# Patient Record
Sex: Male | Born: 1955 | Race: White | Hispanic: No | Marital: Single | Smoking: Never smoker
Health system: Southern US, Community
[De-identification: ages and names within clinical notes are randomized; demographics above are authoritative.]

---

## 2018-09-30 ENCOUNTER — Emergency Department (HOSPITAL_COMMUNITY): Payer: Self-pay

## 2018-09-30 ENCOUNTER — Encounter (HOSPITAL_COMMUNITY): Payer: Self-pay | Admitting: Emergency Medicine

## 2018-09-30 ENCOUNTER — Ambulatory Visit (HOSPITAL_COMMUNITY)
Admission: RE | Admit: 2018-09-30 | Discharge: 2018-09-30 | Disposition: A | Discharge status: Home / Self Care | Payer: Self-pay | Source: Ambulatory Visit | Attending: Urgent Care | Admitting: Urgent Care

## 2018-09-30 ENCOUNTER — Telehealth: Payer: Self-pay

## 2018-09-30 ENCOUNTER — Other Ambulatory Visit: Payer: Self-pay

## 2018-09-30 ENCOUNTER — Emergency Department (HOSPITAL_COMMUNITY)
Admission: EM | Admit: 2018-09-30 | Discharge: 2018-09-30 | Disposition: A | Discharge status: Home / Self Care | Payer: Self-pay | Attending: Emergency Medicine | Admitting: Emergency Medicine

## 2018-09-30 ENCOUNTER — Ambulatory Visit (HOSPITAL_COMMUNITY)
Admission: EM | Admit: 2018-09-30 | Discharge: 2018-09-30 | Disposition: A | Discharge status: Home / Self Care | Payer: Self-pay | Attending: Urgent Care | Admitting: Urgent Care

## 2018-09-30 ENCOUNTER — Encounter (HOSPITAL_COMMUNITY): Payer: Self-pay | Admitting: Urgent Care

## 2018-09-30 DIAGNOSIS — I824Y1 Acute embolism and thrombosis of unspecified deep veins of right proximal lower extremity: Secondary | ICD-10-CM | POA: Insufficient documentation

## 2018-09-30 DIAGNOSIS — R600 Localized edema: Secondary | ICD-10-CM | POA: Insufficient documentation

## 2018-09-30 DIAGNOSIS — I2699 Other pulmonary embolism without acute cor pulmonale: Secondary | ICD-10-CM | POA: Insufficient documentation

## 2018-09-30 DIAGNOSIS — M7989 Other specified soft tissue disorders: Secondary | ICD-10-CM | POA: Insufficient documentation

## 2018-09-30 DIAGNOSIS — R609 Edema, unspecified: Secondary | ICD-10-CM | POA: Insufficient documentation

## 2018-09-30 LAB — BASIC METABOLIC PANEL
Anion gap: 8 (ref 5–15)
BUN: 13 mg/dL (ref 8–23)
CO2: 30 mmol/L (ref 22–32)
Calcium: 9.3 mg/dL (ref 8.9–10.3)
Chloride: 103 mmol/L (ref 98–111)
Creatinine, Ser: 1.34 mg/dL — ABNORMAL HIGH (ref 0.61–1.24)
GFR calc Af Amer: >60 mL/min (ref >60)
GFR calc non Af Amer: 56 mL/min — ABNORMAL LOW (ref >60)
Glucose, Bld: 126 mg/dL — ABNORMAL HIGH (ref 70–99)
Potassium: 4.6 mmol/L (ref 3.5–5.1)
Sodium: 141 mmol/L (ref 135–145)

## 2018-09-30 LAB — CBC
HCT: 44.5 % (ref 39.0–52.0)
Hemoglobin: 14.7 g/dL (ref 13.0–17.0)
MCH: 29.8 pg (ref 26.0–34.0)
MCHC: 33 g/dL (ref 30.0–36.0)
MCV: 90.1 fL (ref 80.0–100.0)
Platelets: 231 10*3/uL (ref 150–400)
RBC: 4.94 MIL/uL (ref 4.22–5.81)
RDW: 12.3 % (ref 11.5–15.5)
WBC: 7 10*3/uL (ref 4.0–10.5)
nRBC: 0 % (ref 0.0–0.2)

## 2018-09-30 MED ORDER — RIVAROXABAN (XARELTO) VTE STARTER PACK (15 & 20 MG): ORAL_TABLET | ORAL | 0 refills | Status: AC

## 2018-09-30 MED ORDER — RIVAROXABAN 15 MG PO TABS: 15.0000 mg | ORAL_TABLET | Freq: Two times a day (BID) | ORAL | 0 refills | Status: DC

## 2018-09-30 MED ORDER — IOHEXOL 350 MG/ML SOLN
75.0000 mL | Freq: Once | INTRAVENOUS | Status: AC | PRN
  Administered 2018-09-30: 75 mL via INTRAVENOUS

## 2018-09-30 NOTE — ED Notes (Signed)
1:00 Vascular ultrasound appt [facility]. Pt given directions and appt time and discharged out of [place] with steady gait.

## 2018-09-30 NOTE — Discharge Instructions (Addendum)
Return here as needed.  Follow-up with the resources provided. °

## 2018-09-30 NOTE — ED Triage Notes (Addendum)
Onset 3 weeks ago developed right lower extremity swelling and pain. Had ultrasound positive for DVT. Was discharged with prescription for Alen Blew however patient riding a bike. Sent to the ED for assistance. States probably couldn't afford the medication.

## 2018-09-30 NOTE — ED Triage Notes (Signed)
Pt here with right leg pain and swelling x 3 weeks

## 2018-09-30 NOTE — ED Provider Notes (Addendum)
MRN: [id number] DOB: 08/19/1955  Subjective:   Martin Zimmerman is a 63 y.o. male presenting for 3 week history of progressively worsening intermittent right lower leg swelling and pain. He thought it was due to salt intake so he cut down on his salt intake, hydrated with water daily. He was eating very unhealthily.  This is made somewhat of a difference but in the past week and especially the last day his right lower leg swelling has returned and is moderate to severely bothersome with associated bruising.  He is not currently taking any medications and has no known food or drug allergies.  Denies past medical and surgical history. Denies smoking cigarettes, quit 15 years ago. Has an occasional beer.  Denies recent surgery, trauma, long distance travel, history of DVT or clotting disorder.  Reports that he has generally been healthy.  He does not have a PCP.  ROS  Objective:   Vitals: BP 123/84 (BP Location: Right Arm)   Pulse 90   Temp 98.4 F (36.9 C) (Oral)   Resp 18   SpO2 96%   BP was 123/84 on recheck by PA Bela Bonaparte at 12:34.  Physical Exam Constitutional:      General: He is not in acute distress.    Appearance: Normal appearance. He is well-developed. He is not ill-appearing, toxic-appearing or diaphoretic.  HENT:     Head: Normocephalic and atraumatic.     Right Ear: External ear normal.     Left Ear: External ear normal.     Nose: Nose normal.     Mouth/Throat:     Mouth: Mucous membranes are moist.     Pharynx: Oropharynx is clear.  Eyes:     General: No scleral icterus.    Extraocular Movements: Extraocular movements intact.     Pupils: Pupils are equal, round, and reactive to light.  Neck:     Musculoskeletal: Normal range of motion and neck supple.     Vascular: No carotid bruit.  Cardiovascular:     Rate and Rhythm: Normal rate and regular rhythm.     Heart sounds: Normal heart sounds. No murmur. No friction rub. No gallop.   Pulmonary:     Effort: Pulmonary effort  is normal. No respiratory distress.     Breath sounds: Normal breath sounds. No stridor. No wheezing, rhonchi or rales.  Musculoskeletal: Normal range of motion.        General: Tenderness present. No signs of injury.     Right lower leg: Edema (1+ up to mid calf, has associated petechiae of the lower leg) present.     Left lower leg: No edema.  Skin:    General: Skin is warm and dry.  Neurological:     General: No focal deficit present.     Mental Status: He is alert and oriented to person, place, and time.     Cranial Nerves: No cranial nerve deficit.     Motor: No weakness.     Coordination: Coordination normal.     Gait: Gait normal.     Deep Tendon Reflexes: Reflexes normal.  Psychiatric:        Mood and Affect: Mood normal.        Behavior: Behavior normal.        Thought Content: Thought content normal.        Judgment: Judgment normal.     Assessment and Plan :   1. Right leg swelling   2. Leg edema     We will  pursue ultrasound of her right lower leg.  Obtained a stat ultrasound for today at 1 PM.  Labs pending, will follow-up thereafter.  Counseled on need to establish with a PCP for further work-up and follow-up.  I placed patient in a work queue with [facility] for PCP assistance and [facility]    Wallis BambergMani, Azhar Knope, PA-C 09/30/18 1252   UPDATE:  Positive DVT by verbal report, imaging findings not posted yet.  Results for orders placed or performed during the hospital encounter of 09/30/18 (from the past 24 hour(s))  CBC     Status: None   Collection Time: 09/30/18 12:39 PM  Result Value Ref Range   WBC 7.0 4.0 - 10.5 K/uL   RBC 4.94 4.22 - 5.81 MIL/uL   Hemoglobin 14.7 13.0 - 17.0 g/dL   HCT 16.144.5 09.639.0 - 04.552.0 %   MCV 90.1 80.0 - 100.0 fL   MCH 29.8 26.0 - 34.0 pg   MCHC 33.0 30.0 - 36.0 g/dL   RDW 40.912.3 81.111.5 - 91.415.5 %   Platelets 231 150 - 400 K/uL   nRBC 0.0 0.0 - 0.2 %  Basic metabolic panel     Status: Abnormal    Collection Time: 09/30/18 12:39 PM  Result Value Ref Range   Sodium 141 135 - 145 mmol/L   Potassium 4.6 3.5 - 5.1 mmol/L   Chloride 103 98 - 111 mmol/L   CO2 30 22 - 32 mmol/L   Glucose, Bld 126 (H) 70 - 99 mg/dL   BUN 13 8 - 23 mg/dL   Creatinine, Ser 7.821.34 (H) 0.61 - 1.24 mg/dL   Calcium 9.3 8.9 - 95.610.3 mg/dL   GFR calc non Af Amer 56 (L) >60 mL/min   GFR calc Af Amer >60 >60 mL/min   Anion gap 8 5 - 15   Discussed results with patient including strict ER precautions.  Will have patient start Xarelto at 15 mg twice daily.  Patient is to follow-up with vascular specialist.  Information provided to patient and his visit summary.   Wallis BambergMani, Moneisha Vosler, PA-C 09/30/18 1336

## 2018-09-30 NOTE — Telephone Encounter (Signed)
Message received from Laurena Slimmer, RN CM requesting a hospital follow up appointment for patient.  Informed her that an appointment has been scheduled for 10/07/2018 @ 1050 @ [place]

## 2018-09-30 NOTE — Discharge Instructions (Addendum)
You have an appointment for an ultrasound of your right lower leg at 1:00.  Make sure you take Entrance C see off of [employer].   For elevated blood pressure, make sure you are monitoring salt in your diet.  Do not eat restaurant foods and limit processed foods at home.  Processed foods include things like frozen meals preseason meats and dinners.  Make sure your pain attention to sodium labels on foods you by at the grocery store.  For seasoning you can use a brand called Mrs. Dash which includes a lot of salt free seasonings.  Salads - kale, spinach, cabbage, spring mix; use seeds like pumpkin seeds or sunflower seeds, almonds; you can also use 1-2 hard boiled eggs in your salads Fruits - avocadoes, berries (blueberries, raspberries, blackberries), apples, oranges, pomegranate, grapefruit Vegetables - aspargus, cauliflower, broccoli, green beans, brussel spouts, bell peppers; stay away from starchy vegetables like potatoes, carrots, peas  Regarding meat it is better to eat lean meats and limit your red meat consumption including pork.  Wild caught fish, chicken breast are good options.  Do not eat any foods on this list that you are allergic to.

## 2018-09-30 NOTE — Progress Notes (Signed)
RLE venous duplex       has been completed. Preliminary results can be found under CV proc through chart review. June Leap, BS, RDMS, RVT    Called positive results to [place], [place]. Patient instructed to pick up [facility]arel[facility] at [facility] today.  This tech discovered patient rode bicycle to appointment and also patient mentioned not being able to afford prescription. Called back to [facility] to discuss with Freida Busman. He agrees best course for patient would be to go to ED.

## 2018-09-30 NOTE — ED Provider Notes (Signed)
[facility]   CSN: [id number] Arrival date & time: 09/30/18  1350     History   Chief Complaint Chief Complaint  Patient presents with   Leg Problem    HPI Julieanne MansonRobert Glacken is a 63 y.o. male.     HPI Patient presents to the emergency department with swelling in the right lower extremity that started 2 weeks ago.  He states that he is somewhat short of breath as well.  Patient had ultrasound that showed DVT.  Patient states that he was unable to for medicine and was sent here to the emergency department for further evaluation of this issue.  Patient denies any chest pain, fever, cough, nausea, vomiting or weakness. History reviewed. No pertinent past medical history.  There are no active problems to display for this patient.   History reviewed. No pertinent surgical history.      Home Medications    Prior to Admission medications   Medication Sig Start Date End Date Taking? Authorizing Provider  Rivaroxaban (XARELTO) 15 MG TABS tablet Take 1 tablet (15 mg total) by mouth 2 (two) times daily with a meal. 09/30/18   Wallis BambergMani, Mario, PA-C    Family History Family History  Problem Relation Age of Onset   Cancer Mother    Hypertension Father     Social History Social History   Tobacco Use   Smoking status: Never Smoker   Smokeless tobacco: Never Used  Substance Use Topics   Alcohol use: Not Currently   Drug use: Never     Allergies   Patient has no known allergies.   Review of Systems Review of Systems  All other systems negative except as documented in the HPI. All pertinent positives and negatives as reviewed in the HPI. Physical Exam Updated Vital Signs BP (!) 108/95    Pulse 71    Temp 98.4 F (36.9 C) (Oral)    Resp (!) 26    Ht 5\' 11"  (1.803 m)    Wt 90.7 kg    SpO2 100%    BMI 27.89 kg/m   Physical Exam Vitals signs and nursing note reviewed.  Constitutional:      General: He is not in  acute distress.    Appearance: He is well-developed.  HENT:     Head: Normocephalic and atraumatic.  Eyes:     Pupils: Pupils are equal, round, and reactive to light.  Neck:     Musculoskeletal: Normal range of motion and neck supple.  Cardiovascular:     Rate and Rhythm: Normal rate and regular rhythm.     Heart sounds: Normal heart sounds. No murmur. No friction rub. No gallop.   Pulmonary:     Effort: Pulmonary effort is normal. No respiratory distress.     Breath sounds: Normal breath sounds. No wheezing.  Abdominal:     General: Bowel sounds are normal. There is no distension.     Palpations: Abdomen is soft.     Tenderness: There is no abdominal tenderness.  Musculoskeletal:     Right lower leg: Edema present.  Skin:    General: Skin is warm and dry.     Capillary Refill: Capillary refill takes less than 2 seconds.     Findings: No erythema or rash.  Neurological:     Mental Status: He is alert and oriented to person, place, and time.     Motor: No abnormal muscle tone.     Coordination: Coordination normal.  Psychiatric:        Behavior: Behavior normal.      ED Treatments / Results  Labs (all labs ordered are listed, but only abnormal results are displayed) Labs Reviewed - No data to display  EKG EKG Interpretation  Date/Time:  Tuesday September 30 2018 16:13:47 EDT Ventricular Rate:  65 PR Interval:    QRS Duration: 94 QT Interval:  403 QTC Calculation: 419 R Axis:   47 Text Interpretation:  Sinus rhythm No STEMI  Confirmed by Alona BeneLong, Joshua [phone]([id number]) on 09/30/2018 4:45:51 PM   Radiology Ct Angio Chest Pe W/cm &/or Wo Cm  Result Date: 09/30/2018 CLINICAL DATA:  Right leg pain and swelling for several weeks EXAM: CT ANGIOGRAPHY CHEST WITH CONTRAST TECHNIQUE: Multidetector CT imaging of the chest was performed using the standard protocol during bolus administration of intravenous contrast. Multiplanar CT image reconstructions and MIPs were obtained to evaluate  the vascular anatomy. CONTRAST:  75mL OMNIPAQUE IOHEXOL 350 MG/ML SOLN COMPARISON:  None. FINDINGS: Cardiovascular: Thoracic aorta is within normal limits without aneurysmal dilatation or dissection. The pulmonary artery is well visualized within normal branching pattern. Filling defects are noted primarily in the lower lobes bilaterally slightly worse on the left than the right consistent with pulmonary emboli. No right heart strain is identified. Mediastinum/Nodes: Sliding-type hiatal hernia is noted. No hilar or mediastinal adenopathy is noted. The thoracic inlet is within normal limits. Lungs/Pleura: Emphysematous changes are noted. No focal infiltrate or sizable effusion is seen. No pleural effusion is seen. Upper Abdomen: No acute abnormality. Musculoskeletal: No chest wall abnormality. No acute or significant osseous findings. Review of the MIP images confirms the above findings. IMPRESSION: Evidence of mild pulmonary emboli without heart strain. Sliding-type hiatal hernia. Electronically Signed   By: Alcide CleverMark  Lukens M.D.   On: 09/30/2018 19:22   [facility] [place] Lower Extremity Venous (dvt) (only Mc & Wl)  Result Date: 09/30/2018  Lower Venous Study Indications: RLE swelling x 3 weeks.  Performing Technologist: Jeb LeveringJill Parker RDMS, RVT  Examination Guidelines: A complete evaluation includes B-mode imaging, spectral Doppler, color Doppler, and power Doppler as needed of all accessible portions of each vessel. Bilateral testing is considered an integral part of a complete examination. Limited examinations for reoccurring indications may be performed as noted.  +---------+---------------+---------+-----------+----------+-------------------+  RIGHT     Compressibility Phasicity Spontaneity Properties Summary              +---------+---------------+---------+-----------+----------+-------------------+  CFV       Partial         Yes       Yes                    distal segment                                                                    thrombus- mildly                                                                 mobile               +---------+---------------+---------+-----------+----------+-------------------+  SFJ       Full            Yes       Yes                                         +---------+---------------+---------+-----------+----------+-------------------+  FV Prox   None                                                                  +---------+---------------+---------+-----------+----------+-------------------+  FV Mid    None                                                                  +---------+---------------+---------+-----------+----------+-------------------+  FV Distal None                                                                  +---------+---------------+---------+-----------+----------+-------------------+  PFV       Full                                                                  +---------+---------------+---------+-----------+----------+-------------------+  POP       None            No        No                                          +---------+---------------+---------+-----------+----------+-------------------+  PTV       Partial                                                               +---------+---------------+---------+-----------+----------+-------------------+  PERO      Partial                                                               +---------+---------------+---------+-----------+----------+-------------------+   +----+---------------+---------+-----------+----------+-------+  LEFT Compressibility Phasicity Spontaneity Properties Summary  +----+---------------+---------+-----------+----------+-------+  CFV  Full            Yes  Yes                             +----+---------------+---------+-----------+----------+-------+  SFJ  Full                                                      +----+---------------+---------+-----------+----------+-------+      Summary: Right: Findings consistent with acute deep vein thrombosis involving the distal right common femoral vein, right femoral vein, right popliteal vein, right posterior tibial veins, and right peroneal veins. Left: No evidence of common femoral vein obstruction.  *See table(s) above for measurements and observations. Electronically signed by Coral ElseVance Brabham MD on 09/30/2018 at 8:41:47 PM.    Final     Procedures Procedures (including critical care time)  Medications Ordered in ED Medications  iohexol (OMNIPAQUE) 350 MG/ML injection 75 mL (75 mLs Intravenous Contrast Given 09/30/18 1913)     Initial Impression / Assessment and Plan / ED Course  I have reviewed the triage vital signs and the nursing notes.  Pertinent labs & imaging results that were available during my care of the patient were reviewed by me and considered in my medical decision making (see chart for details).        Case manager was able to secure the patient's medications had no cost.  She also set up a follow-up appointment with him at the wellness center.  Patient does have mild very small PEs in the periphery in the lower lobes.  Patient has been stable with normal vital signs and normal pulse oximetry.  He is walked without any issues or increasing shortness of breath or tachypnea.  Final Clinical Impressions(s) / ED Diagnoses   Final diagnoses:  None    ED Discharge Orders    None       Charlestine NightLawyer, Pamela Intrieri, PA-C 10/01/18 0004    Maia PlanLong, Joshua G, MD 10/01/18 [phone]

## 2018-10-07 ENCOUNTER — Ambulatory Visit: Payer: Self-pay | Admitting: Family Medicine

## 2018-11-21 ENCOUNTER — Encounter: Payer: Self-pay | Admitting: Vascular Surgery

## 2019-06-24 ENCOUNTER — Encounter (HOSPITAL_COMMUNITY): Payer: Self-pay | Admitting: Emergency Medicine

## 2019-06-24 ENCOUNTER — Ambulatory Visit (HOSPITAL_COMMUNITY)
Admission: EM | Admit: 2019-06-24 | Discharge: 2019-06-24 | Disposition: A | Discharge status: Home / Self Care | Payer: Self-pay | Attending: Family Medicine | Admitting: Family Medicine

## 2019-06-24 ENCOUNTER — Other Ambulatory Visit: Payer: Self-pay

## 2019-06-24 DIAGNOSIS — W57XXXA Bitten or stung by nonvenomous insect and other nonvenomous arthropods, initial encounter: Secondary | ICD-10-CM

## 2019-06-24 DIAGNOSIS — S30861A Insect bite (nonvenomous) of abdominal wall, initial encounter: Secondary | ICD-10-CM

## 2019-06-24 NOTE — ED Provider Notes (Signed)
[facility] CARE CENTER   [id number] 06/24/19 Arrival Time: 1150  ASSESSMENT & PLAN:  1. Tick bite of abdominal wall, initial encounter     No signs of remaining tick in skin. No symptoms otherwise. Discussed return should he develop flu-like symptoms. No signs of bacterial infection. Reassured.  Follow-up Information    [facility].   Specialty: Urgent Care Why: As needed. Contact information: [address] [place] [address] [phone]          Reviewed expectations re: course of current medical issues. Questions answered. Outlined signs and symptoms indicating need for more acute intervention. Understanding verbalized. After Visit Summary given.   SUBJECTIVE: History from: patient. Martin Zimmerman is a 64 y.o. male who reports finding a tick on his R side/buttock. Removed at home "but not sure I got all of it out". Red area at site of bite; stable. No drainage. Afebrile. Ambulatory without difficulty. Normal PO intake without n/v/d.    OBJECTIVE:  Vitals:   06/24/19 1237  BP: 113/74  Pulse: 75  Resp: 16  Temp: 98.7 F (37.1 C)  TempSrc: Oral  SpO2: 97%    General appearance: alert; no distress Eyes: PERRLA; EOMI; conjunctiva normal HENT: [place]; AT Neck: supple  Lungs: speaks full sentences without difficulty; unlabored Extremities: no edema Skin: warm and dry; R side/buttock with approx 1 cm area of erythema at site of tick bite; no retained FB Neurologic: normal gait Psychological: alert and cooperative; normal mood and affect   Allergies  Allergen Reactions  . Codeine Hives    History reviewed. No pertinent past medical history. Social History   Socioeconomic History  . Marital status: Single    Spouse name: Not on file  . Number of children: Not on file  . Years of education: Not on file  . Highest education level: Not on file  Occupational History  . Not on file  Tobacco Use  .  Smoking status: Never Smoker  . Smokeless tobacco: Never Used  Substance and Sexual Activity  . Alcohol use: Not Currently  . Drug use: Never  . Sexual activity: Not on file  Other Topics Concern  . Not on file  Social History Narrative  . Not on file   Social Determinants of Health   Financial Resource Strain:   . Difficulty of Paying Living Expenses:   Food Insecurity:   . Worried About Programme researcher, broadcasting/film/video in the Last Year:   . Barista in the Last Year:   Transportation Needs:   . Freight forwarder (Medical):   Marland Kitchen Lack of Transportation (Non-Medical):   Physical Activity:   . Days of Exercise per Week:   . Minutes of Exercise per Session:   Stress:   . Feeling of Stress :   Social Connections:   . Frequency of Communication with Friends and Family:   . Frequency of Social Gatherings with Friends and Family:   . Attends Religious Services:   . Active Member of Clubs or Organizations:   . Attends Banker Meetings:   Marland Kitchen Marital Status:   Intimate Partner Violence:   . Fear of Current or Ex-Partner:   . Emotionally Abused:   Marland Kitchen Physically Abused:   . Sexually Abused:    Family History  Problem Relation Age of Onset  . Cancer Mother   . Hypertension Father    History reviewed. No pertinent surgical history.   Mardella Layman, MD 06/24/19  1450  

## 2019-06-24 NOTE — ED Triage Notes (Signed)
He attempted to remove a tick from left side Sunday. He believes he did not remove it completely.

## 2020-07-24 IMAGING — CT CT ANGIOGRAPHY CHEST
1 of 6 series · 3 of 16 positions shown · IV contrast (omnipaque)
Comparison: None.

CLINICAL DATA: Right leg pain and swelling for several weeks

EXAM:
CT ANGIOGRAPHY CHEST WITH CONTRAST
TECHNIQUE: Multidetector CT imaging of the chest was performed using the
standard protocol during bolus administration of intravenous
contrast. Multiplanar CT image reconstructions and MIPs were
obtained to evaluate the vascular anatomy.
CONTRAST:  75mL OMNIPAQUE IOHEXOL 350 MG/ML SOLN

[Series 7: pe thins · axial · 0.74mm/px · z∈[+54,+182]mm · 3 of 365 slices shown]
[im 92/365  lung]
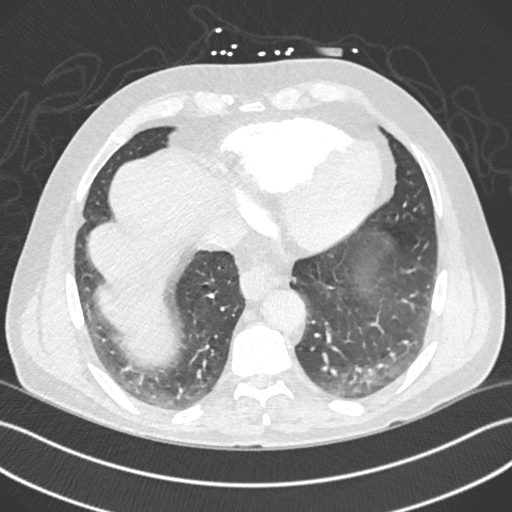
[im 183/365  soft-tissue]
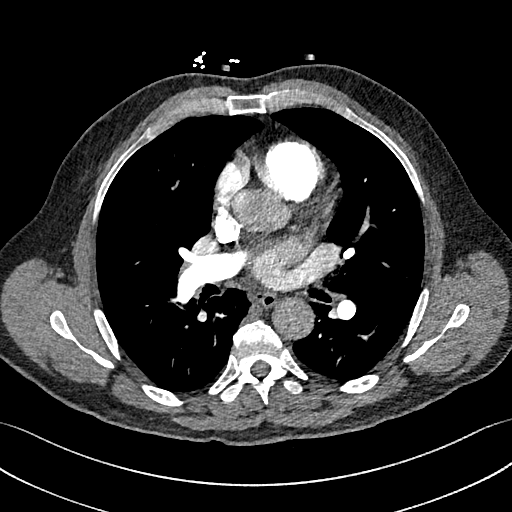
[im 274/365  lung]
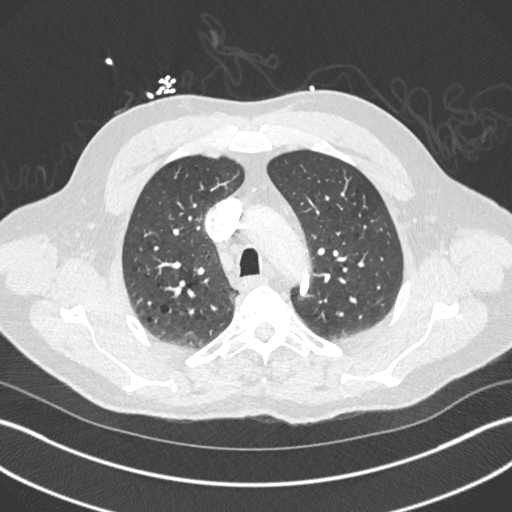

[3 of 16 positions shown; findings below may reference images not displayed]

FINDINGS: Cardiovascular: Thoracic aorta is within normal limits without
aneurysmal dilatation or dissection. The pulmonary artery is well
visualized within normal branching pattern. Filling defects are
noted primarily in the lower lobes bilaterally slightly worse on the
left than the right consistent with pulmonary emboli. No right heart
strain is identified.

Mediastinum/Nodes: Sliding-type hiatal hernia is noted. No hilar or
mediastinal adenopathy is noted. The thoracic inlet is within normal
limits.

Lungs/Pleura: Emphysematous changes are noted. No focal infiltrate
or sizable effusion is seen. No pleural effusion is seen.

Upper Abdomen: No acute abnormality.

Musculoskeletal: No chest wall abnormality. No acute or significant
osseous findings.

Review of the MIP images confirms the above findings.
IMPRESSION: Evidence of mild pulmonary emboli without heart strain.

Sliding-type hiatal hernia.
